# Patient Record
Sex: Male | Born: 1990 | Race: White | Hispanic: No | Marital: Single | Smoking: Never smoker
Health system: Southern US, Community
[De-identification: ages and names within clinical notes are randomized; demographics above are authoritative.]

## PROBLEM LIST (undated history)

## (undated) DIAGNOSIS — G43909 Migraine, unspecified, not intractable, without status migrainosus: Secondary | ICD-10-CM

---
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
## 2015-11-01 ENCOUNTER — Encounter (HOSPITAL_COMMUNITY): Payer: Self-pay

## 2015-11-01 ENCOUNTER — Emergency Department (HOSPITAL_COMMUNITY): Payer: BLUE CROSS/BLUE SHIELD

## 2015-11-01 ENCOUNTER — Emergency Department (HOSPITAL_COMMUNITY)
Admission: EM | Admit: 2015-11-01 | Discharge: 2015-11-02 | Disposition: A | Discharge status: Home / Self Care | Payer: BLUE CROSS/BLUE SHIELD | Attending: Emergency Medicine | Admitting: Emergency Medicine

## 2015-11-01 DIAGNOSIS — Y9389 Activity, other specified: Secondary | ICD-10-CM | POA: Diagnosis not present

## 2015-11-01 DIAGNOSIS — W311XXA Contact with metalworking machines, initial encounter: Secondary | ICD-10-CM | POA: Insufficient documentation

## 2015-11-01 DIAGNOSIS — S61442A Puncture wound with foreign body of left hand, initial encounter: Secondary | ICD-10-CM | POA: Diagnosis not present

## 2015-11-01 DIAGNOSIS — Y9289 Other specified places as the place of occurrence of the external cause: Secondary | ICD-10-CM | POA: Insufficient documentation

## 2015-11-01 DIAGNOSIS — Y998 Other external cause status: Secondary | ICD-10-CM | POA: Insufficient documentation

## 2015-11-01 DIAGNOSIS — Z23 Encounter for immunization: Secondary | ICD-10-CM | POA: Diagnosis not present

## 2015-11-01 DIAGNOSIS — S6992XA Unspecified injury of left wrist, hand and finger(s), initial encounter: Secondary | ICD-10-CM | POA: Diagnosis present

## 2015-11-01 DIAGNOSIS — S61432A Puncture wound without foreign body of left hand, initial encounter: Secondary | ICD-10-CM

## 2015-11-01 MED ORDER — HYDROCODONE-ACETAMINOPHEN 5-325 MG PO TABS
1.0000 | ORAL_TABLET | Freq: Once | ORAL | Status: AC
  Administered 2015-11-01: 1 via ORAL
  Filled 2015-11-01: qty 1

## 2015-11-01 MED ORDER — LIDOCAINE-EPINEPHRINE (PF) 2 %-1:200000 IJ SOLN
10.0000 mL | Freq: Once | INTRAMUSCULAR | Status: AC
  Administered 2015-11-01: 10 mL via INTRADERMAL
  Filled 2015-11-01: qty 10

## 2015-11-01 MED ORDER — TETANUS-DIPHTH-ACELL PERTUSSIS 5-2.5-18.5 LF-MCG/0.5 IM SUSP
0.5000 mL | Freq: Once | INTRAMUSCULAR | Status: AC
  Administered 2015-11-01: 0.5 mL via INTRAMUSCULAR
  Filled 2015-11-01: qty 0.5

## 2015-11-01 NOTE — ED Notes (Signed)
Pt presents with left hand injury from drill.  Pt thinks there might be pieces of metal in would, bleeding controlled, friend at bedside

## 2015-11-01 NOTE — ED Notes (Signed)
Pt stable, ambulatory, states understanding of discharge instructions 

## 2015-11-01 NOTE — Discharge Instructions (Signed)
Keep your wound clean and covered. Schedule an appointment with Dr. Izora Ribas if your symptoms persist. Return to ED with swelling of the hand, redness of the hand, inability to move the fingers or wrist, increasing pain or any other concerning symptoms.    Puncture Wound A puncture wound is an injury that is caused by a sharp, thin object that goes through (penetrates) your skin. Usually, a puncture wound does not leave a large opening in your skin, so it may not bleed a lot. However, when you get a puncture wound, dirt or other materials (foreign bodies) can be forced into your wound and break off inside. This increases the chance of infection, such as tetanus. CAUSES Puncture wounds are caused by any sharp, thin object that goes through your skin, such as:  Animal teeth, as with an animal bite.  Sharp, pointed objects, such as nails, splinters of glass, fishhooks, and needles. SYMPTOMS Symptoms of a puncture wound include:  Pain.  Bleeding.  Swelling.  Bruising.  Fluid leaking from the wound.  Numbness, tingling, or loss of function. DIAGNOSIS This condition is diagnosed with a medical history and physical exam. Your wound will be checked to see if it contains any foreign bodies. You may also have X-rays or other imaging tests. TREATMENT Treatment for a puncture wound depends on how serious the wound is. It also depends on whether the wound contains any foreign bodies. Treatment for all types of puncture wounds usually starts with:  Controlling the bleeding.  Washing out the wound with a germ-free (sterile) salt-water solution.  Checking the wound for foreign bodies. Treatment may also include:  Having the wound opened surgically to remove a foreign object.  Closing the wound with stitches (sutures) if it continues to bleed.  Covering the wound with antibiotic ointments and a bandage (dressing).  Receiving a tetanus shot.  Receiving a rabies vaccine. HOME CARE  INSTRUCTIONS Medicines  Take or apply over-the-counter and prescription medicines only as told by your health care provider.  If you were prescribed an antibiotic, take or apply it as told by your health care provider. Do not stop using the antibiotic even if your condition improves. Wound Care  There are many ways to close and cover a wound. For example, a wound can be covered with sutures, skin glue, or adhesive strips. Follow instructions from your health care provider about:  How to take care of your wound.  When and how you should change your dressing.  When you should remove your dressing.  Removing whatever was used to close your wound.  Keep the dressing dry as told by your health care provider. Do not take baths, swim, use a hot tub, or do anything that would put your wound underwater until your health care provider approves.  Clean the wound as told by your health care provider.  Do not scratch or pick at the wound.  Check your wound every day for signs of infection. Watch for:  Redness, swelling, or pain.  Fluid, blood, or pus. General Instructions  Raise (elevate) the injured area above the level of your heart while you are sitting or lying down.  If your puncture wound is in your foot, ask your health care provider if you need to avoid putting weight on your foot and for how long.  Keep all follow-up visits as told by your health care provider. This is important. SEEK MEDICAL CARE IF:  You received a tetanus shot and you have swelling, severe pain, redness,  or bleeding at the injection site.  You have a fever.  Your sutures come out.  You notice a bad smell coming from your wound or your dressing.  You notice something coming out of your wound, such as wood or glass.  Your pain is not controlled with medicine.  You have increased redness, swelling, or pain at the site of your wound.  You have fluid, blood, or pus coming from your wound.  You notice  a change in the color of your skin near your wound.  You need to change the dressing frequently due to fluid, blood, or pus draining from your wound.  You develop a new rash.  You develop numbness around your wound. SEEK IMMEDIATE MEDICAL CARE IF:  You develop severe swelling around your wound.  Your pain suddenly increases and is severe.  You develop painful skin lumps.  You have a red streak going away from your wound.  The wound is on your hand or foot and you cannot properly move a finger or toe.  The wound is on your hand or foot and you notice that your fingers or toes look pale or bluish.   This information is not intended to replace advice given to you by your health care provider. Make sure you discuss any questions you have with your health care provider.   Document Released: 06/28/2005 Document Revised: 06/09/2015 Document Reviewed: 11/11/2014 Elsevier Interactive Patient Education [employer]

## 2015-11-01 NOTE — ED Provider Notes (Signed)
CSN: [id number]     Arrival date & time 11/01/15  2050 History  By signing my name below, I, Soijett Blue, attest that this documentation has been prepared under the direction and in the presence of Alveta Heimlich, PA-C Electronically Signed: Soijett Blue, ED Scribe. 11/01/2015. 9:19 PM.   Chief Complaint  Patient presents with  . Hand Injury   The history is provided by the patient. No language interpreter was used.   Darrell Roberts is a 25 y.o. male who presents to the Emergency Department complaining of left hand injury occurring 3:30 PM. He notes that he was drilling into metal when the drill bit drilled into his lateral second MCP and he believes that the bit went into his hand 0.25 inches. He notes that he still have ROM of his left hand. Pt is unsure of the status of his tetanus at this time. Pt is having associated symptoms of numbness to the left index finger. He notes that he has tried 3 OTC ibuprofen with mild relief of his symptoms. He denies color change, swelling, and any other symptoms.   History reviewed. No pertinent past medical history. History reviewed. No pertinent past surgical history. History reviewed. No pertinent family history. Social History  Substance Use Topics  . Smoking status: Never Smoker   . Smokeless tobacco: Never Used  . Alcohol Use: Yes    Review of Systems  Musculoskeletal: Negative for joint swelling.  Skin: Positive for wound. Negative for color change.  Neurological: Positive for numbness (to the left fingers).  All other systems reviewed and are negative.     Allergies  Review of patient's allergies indicates no known allergies.  Home Medications   Prior to Admission medications   Not on File   BP 143/85 mmHg  Pulse 69  Temp(Src) 98.3 F (36.8 C) (Oral)  Resp 18  SpO2 100% Physical Exam  Constitutional: He appears well-developed and well-nourished. No distress.  HENT:  Head: Normocephalic and atraumatic.  Eyes: Conjunctivae  are normal. Right eye exhibits no discharge. Left eye exhibits no discharge. No scleral icterus.  Neck: Normal range of motion.  Cardiovascular: Normal rate, regular rhythm and intact distal pulses.   Cap refill < 3 seconds  Pulmonary/Chest: Effort normal. No respiratory distress.  Musculoskeletal: Normal range of motion.       Left hand: He exhibits tenderness and laceration. He exhibits normal range of motion, normal capillary refill, no deformity and no swelling. Normal sensation noted. Normal strength noted.       Hands: 0.5 cm puncture wound noted to left lateral hand immediately below 2nd MCP joint. Mild tenderness over site of puncture. No active bleeding. FROM of the digits and wrist. No swelling or deformity.   Neurological: He is alert. Coordination normal.  5/5 grip and wrist strength. Sensation to light touch intact throughout though pt reports slightly decreased sensation to left index finger.   Skin: Skin is warm and dry.  Psychiatric: He has a normal mood and affect. His behavior is normal.  Nursing note and vitals reviewed.   ED Course  Procedures (including critical care time) DIAGNOSTIC STUDIES: Oxygen Saturation is 100% on RA, nl by my interpretation.    COORDINATION OF CARE: 9:19 PM Discussed treatment plan with pt at bedside which includes tetanus updated, norco, and left hand xray and pt agreed to plan.  Labs Review Labs Reviewed - No data to display  Imaging Review Dg Hand Complete Left  11/01/2015  CLINICAL DATA:  Puncture wound  to the index finger today. EXAM: LEFT HAND - COMPLETE 3+ VIEW COMPARISON:  None. FINDINGS: There is small metal fragments in the subcutaneous soft tissues at the radial aspect of the second metacarpal phalangeal joint. The bones are normal. IMPRESSION: Metallic foreign bodies in the soft tissues at the site of puncture. Electronically Signed   By: Francene Boyers M.D.   On: 11/01/2015 21:36   I have personally reviewed and evaluated these  images as part of my medical decision-making.   EKG Interpretation None      MDM   Final diagnoses:  Puncture wound, hand, left, initial encounter   Patient presenting with puncture wound to left hand from drill bit with depth approximately 1/4 inch. Associated symptoms include numbness of left index finger. Left hand is neurovascularly intact with FROM. Patient X-Ray negative for obvious fracture or dislocation. Multiple small metallic foreign bodies noted superficially. Pain managed in ED with vicodin. Wound infiltrated with lidocaine, opened with scalpel to 1 cm width and irrigated with 100 cc fluid. Wound dressed and covered in ED. Discussed proper wound care and use of OTC pain relievers. Pt advised to follow up with hand surgery if symptoms persist. Return precautions discussed at bedside and given in discharge paperwork. Pt is stable for discharge.  I personally performed the services described in this documentation, which was scribed in my presence. The recorded information has been reviewed and is accurate.    Rolm Gala Levii Hairfield, PA-C 11/01/15 2330  Eber Hong, MD 11/02/15 [phone]

## 2016-06-19 ENCOUNTER — Emergency Department (HOSPITAL_COMMUNITY)
Admission: EM | Admit: 2016-06-19 | Discharge: 2016-06-19 | Disposition: A | Discharge status: Home / Self Care | Payer: BLUE CROSS/BLUE SHIELD | Attending: Dermatology | Admitting: Dermatology

## 2016-06-19 ENCOUNTER — Encounter (HOSPITAL_COMMUNITY): Payer: Self-pay | Admitting: Emergency Medicine

## 2016-06-19 DIAGNOSIS — Y999 Unspecified external cause status: Secondary | ICD-10-CM | POA: Insufficient documentation

## 2016-06-19 DIAGNOSIS — M545 Low back pain: Secondary | ICD-10-CM | POA: Diagnosis not present

## 2016-06-19 DIAGNOSIS — Y92410 Unspecified street and highway as the place of occurrence of the external cause: Secondary | ICD-10-CM | POA: Diagnosis not present

## 2016-06-19 DIAGNOSIS — Z5321 Procedure and treatment not carried out due to patient leaving prior to being seen by health care provider: Secondary | ICD-10-CM | POA: Insufficient documentation

## 2016-06-19 DIAGNOSIS — R079 Chest pain, unspecified: Secondary | ICD-10-CM | POA: Insufficient documentation

## 2016-06-19 DIAGNOSIS — Y9389 Activity, other specified: Secondary | ICD-10-CM | POA: Insufficient documentation

## 2016-06-19 HISTORY — DX: Migraine, unspecified, not intractable, without status migrainosus: G43.909
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
## 2016-08-31 IMAGING — DX DG HAND COMPLETE 3+V*L*
3 series · 3 of 3 positions shown · non-contrast
Comparison: None.

CLINICAL DATA: Puncture wound to the index finger today.

EXAM:
LEFT HAND - COMPLETE 3+ VIEW

[hand pa]
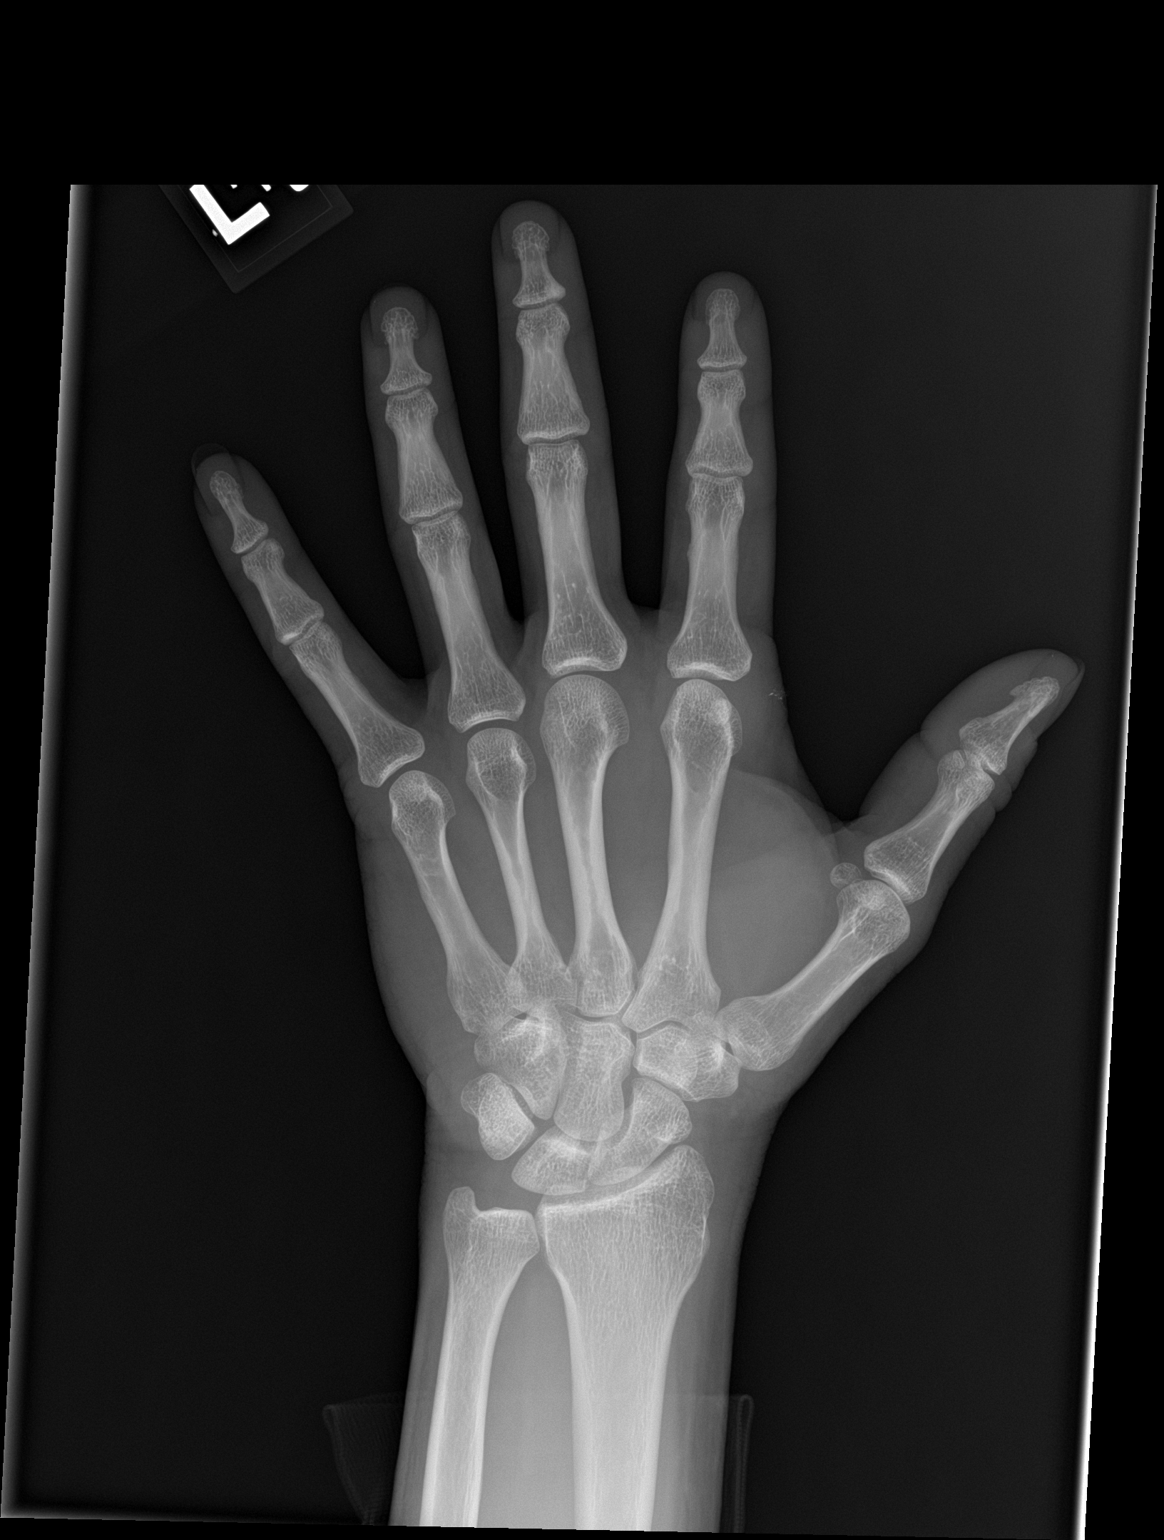

[hand obl]
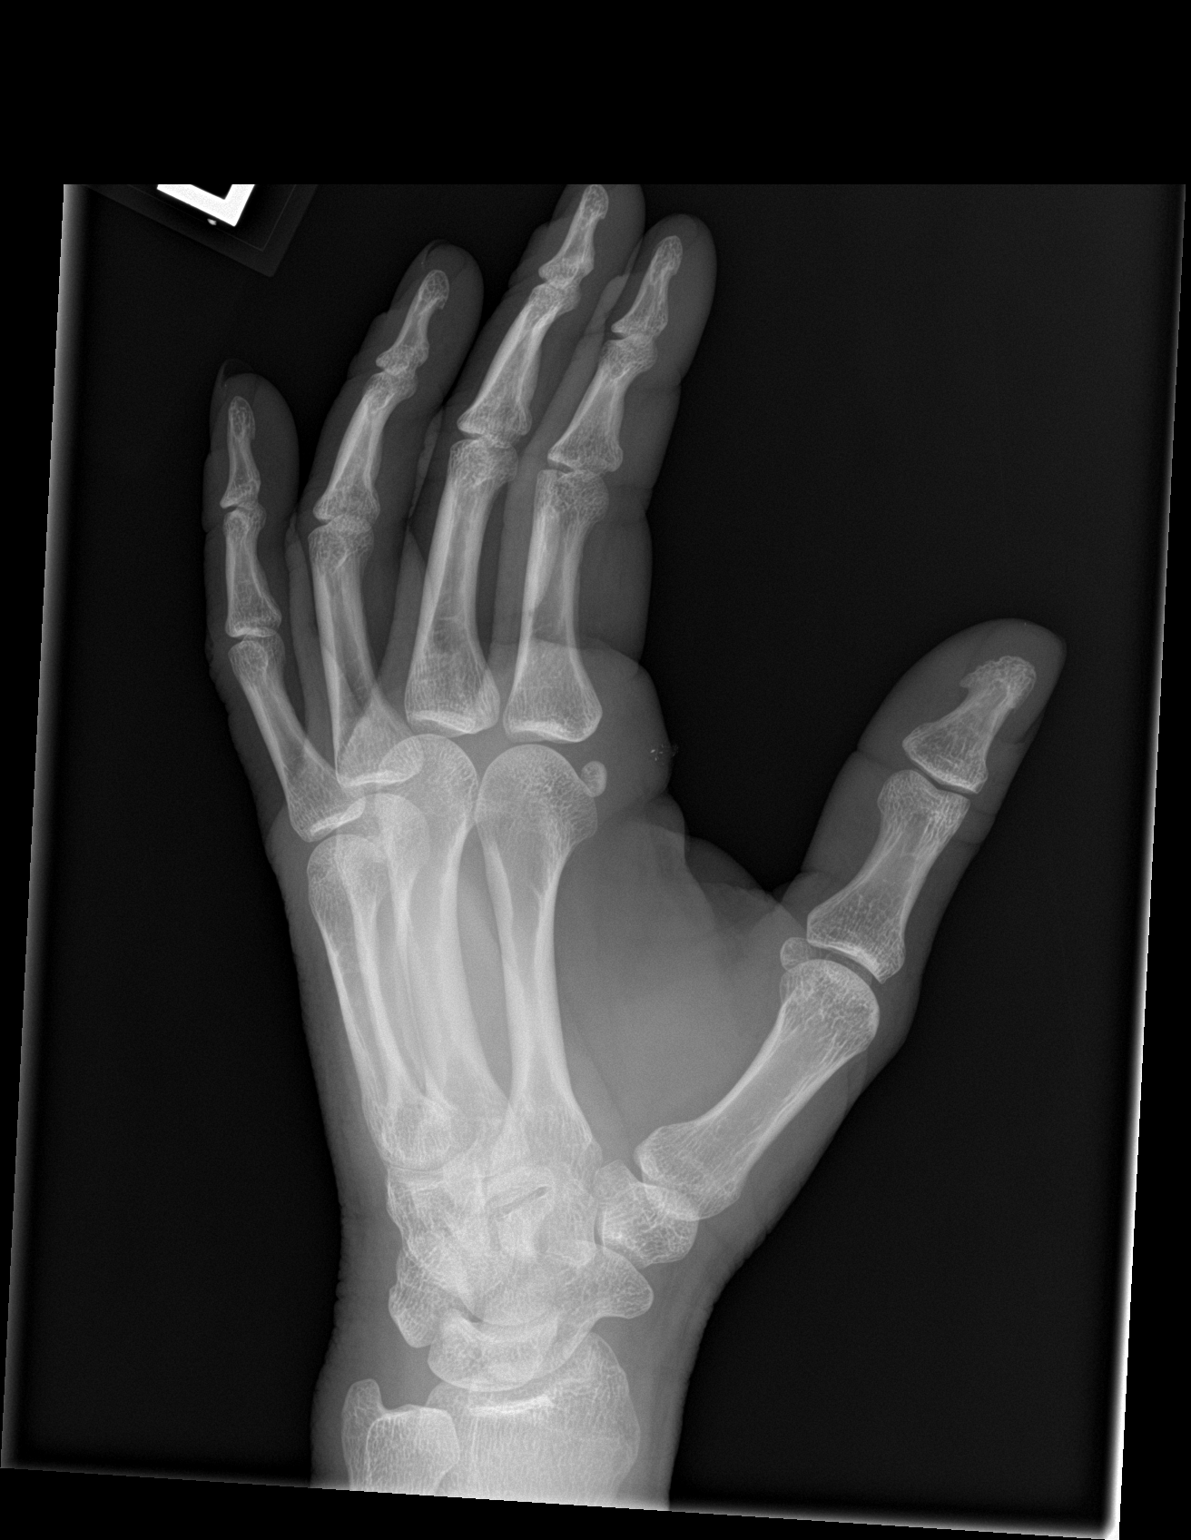

[hand lat]
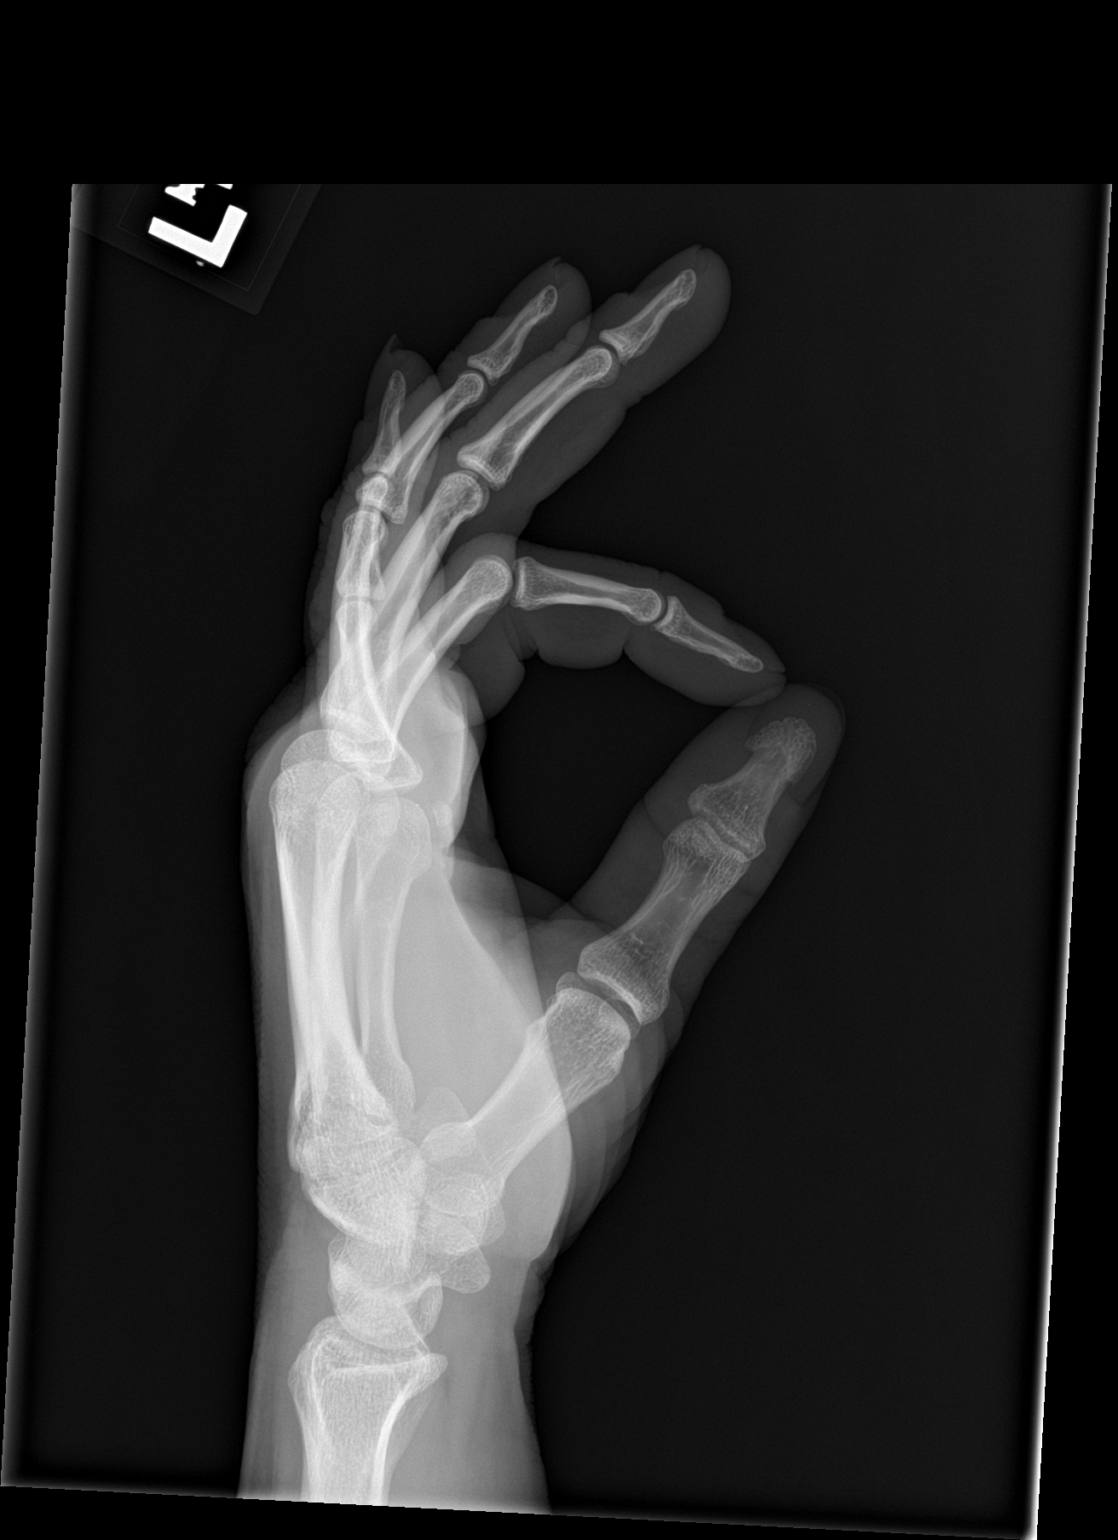

[3 of 3 positions shown; findings below may reference images not displayed]

FINDINGS: There is small metal fragments in the subcutaneous soft tissues at
the radial aspect of the second metacarpal phalangeal joint. The
bones are normal.
IMPRESSION: Metallic foreign bodies in the soft tissues at the site of puncture.

## 2023-01-13 ENCOUNTER — Other Ambulatory Visit: Payer: Self-pay

## 2023-01-13 ENCOUNTER — Emergency Department (HOSPITAL_COMMUNITY)
Admission: EM | Admit: 2023-01-13 | Discharge: 2023-01-13 | Discharge status: Against Medical Advice | Payer: 59 | Attending: Emergency Medicine | Admitting: Emergency Medicine

## 2023-01-13 DIAGNOSIS — T402X1A Poisoning by other opioids, accidental (unintentional), initial encounter: Secondary | ICD-10-CM | POA: Diagnosis not present

## 2023-01-13 DIAGNOSIS — Z5329 Procedure and treatment not carried out because of patient's decision for other reasons: Secondary | ICD-10-CM | POA: Insufficient documentation

## 2023-01-13 DIAGNOSIS — T40601A Poisoning by unspecified narcotics, accidental (unintentional), initial encounter: Secondary | ICD-10-CM

## 2023-01-13 NOTE — ED Provider Notes (Signed)
[place] EMERGENCY DEPARTMENT AT [facility] Provider Note   CSN: [id number] Arrival date & time: 01/13/23  1550     History  Chief Complaint  Patient presents with   Drug Overdose    Darrell Roberts is a 32 y.o. male.  The history is provided by the patient and medical records. No language interpreter was used.  Drug Overdose     32 year old male BIB EMS from a [employer] parking lot due to suspect overdose.  Per EMS note, patient was found to be hard to arouse with a syringe on the scene.  He does have known history of drug use.  Patient did receive 0.5 mg of Narcan IM on scene with improvement of symptoms.  Patient brought here for evaluation.  Initial CBG was 250.  At this time patient is able to provide report.  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  He denies SI HI.  He does have Narcan available in his car.  He denies any pain at this time.  He reported he feels remorseful for his drug use  Home Medications Prior to Admission medications   Not on File      Allergies    Patient has no known allergies.    Review of Systems   Review of Systems  All other systems reviewed and are negative.   Physical Exam Updated Vital Signs BP (!) 151/106 (BP Location: Left Arm)   Pulse (!) 110   Temp 98.7 F (37.1 C) (Oral)   Resp 14   Ht 5\' 10"  (1.778 m)   Wt 81.6 kg   SpO2 97%   BMI 25.83 kg/m  Physical Exam Vitals and nursing note reviewed.  Constitutional:      General: He is not in acute distress.    Appearance: He is well-developed.  HENT:     Head: Atraumatic.  Eyes:     Conjunctiva/sclera: Conjunctivae normal.  Cardiovascular:     Rate and Rhythm: Normal rate and regular rhythm.     Pulses: Normal pulses.     Heart sounds: Normal heart sounds.  Pulmonary:     Effort: Pulmonary effort is normal.     Breath sounds: Normal breath sounds.  Abdominal:      General: Abdomen is flat.     Palpations: Abdomen is soft.  Musculoskeletal:     Cervical back: Neck supple.  Skin:    Findings: No rash.  Neurological:     Mental Status: He is alert.     Comments: Patient appears a bit drowsy but answer questions appropriately and in no acute discomfort.  Psychiatric:        Thought Content: Thought content does not include homicidal or suicidal ideation.     ED Results / Procedures / Treatments   Labs (all labs ordered are listed, but only abnormal results are displayed) Labs Reviewed - No data to display  EKG None  Radiology No results found.  Procedures Procedures    Medications Ordered in ED Medications - No data to display  ED Course/ Medical Decision Making/ A&P                             Medical Decision Making  BP (!) 151/106 (BP Location: Left Arm)   Pulse (!) 110   Temp 98.7 F (37.1 C) (Oral)   Resp  14   Ht 5\' 10"  (1.778 m)   Wt 81.6 kg   SpO2 97%   BMI 25.83 kg/m   37:29 PM  32 year old male BIB EMS from a [employer] parking lot due to suspect overdose.  Per EMS note, patient was found to be hard to arouse with a syringe on the scene.  He does have known history of drug use.  Patient did receive 0.5 mg of Narcan IM on scene with improvement of symptoms.  Patient brought here for evaluation.  Initial CBG was 250.  At this time patient is able to provide report.  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  He denies SI HI.  He does have Narcan available in his car.  He denies any pain at this time.  He reported he feels remorseful for his drug use  On exam, patient does appear a bit drowsy but answering question appropriately and in no acute discomfort.  Patient appears remorseful.  Heart with mild tachycardia, lungs clear to auscultation abdomen soft nontender.  Track marks noted in bilateral forearms without signs of infection.  At  this time, will continue to monitor patient to ensure he does not need additional Narcan.  This is likely an unintentional drug overdose.  He denies alcohol use.  No SI or HI.  4:42 PM I went back to check on patient but he appears not in the room.  I request nursing staff to look for patient.  5:05 PM Patient has eloped.  When I last talked to him he was standing, conversant, and less drowsy than earlier.  He did indicate that he has Narcan available in his car.        Final Clinical Impression(s) / ED Diagnoses Final diagnoses:  Overdose opiate, accidental or unintentional, initial encounter    Rx / DC Orders ED Discharge Orders     None         Fayrene Helper, PA-C 01/13/23 1706    Gloris Manchester, MD 01/14/23 [phone]
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
## 2023-04-25 DIAGNOSIS — Y92410 Unspecified street and highway as the place of occurrence of the external cause: Secondary | ICD-10-CM | POA: Diagnosis not present

## 2023-04-25 DIAGNOSIS — R519 Headache, unspecified: Secondary | ICD-10-CM | POA: Diagnosis not present

## 2023-04-25 DIAGNOSIS — M542 Cervicalgia: Secondary | ICD-10-CM | POA: Diagnosis not present

## 2023-04-25 DIAGNOSIS — S01511A Laceration without foreign body of lip, initial encounter: Secondary | ICD-10-CM | POA: Diagnosis not present

## 2023-05-11 DIAGNOSIS — T40411A Poisoning by fentanyl or fentanyl analogs, accidental (unintentional), initial encounter: Secondary | ICD-10-CM | POA: Diagnosis not present

## 2023-05-11 DIAGNOSIS — Y92002 Bathroom of unspecified non-institutional (private) residence single-family (private) house as the place of occurrence of the external cause: Secondary | ICD-10-CM | POA: Diagnosis not present

## 2023-05-11 DIAGNOSIS — R400 Somnolence: Secondary | ICD-10-CM | POA: Diagnosis not present

## 2023-05-11 DIAGNOSIS — R404 Transient alteration of awareness: Secondary | ICD-10-CM | POA: Diagnosis not present
